# Patient Record
Sex: Male | Born: 1955
Health system: Southern US, Community
[De-identification: ages and names within clinical notes are randomized; demographics above are authoritative.]

## PROBLEM LIST (undated history)

## (undated) DIAGNOSIS — E119 Type 2 diabetes mellitus without complications: Secondary | ICD-10-CM

## (undated) DIAGNOSIS — M1990 Unspecified osteoarthritis, unspecified site: Secondary | ICD-10-CM

## (undated) DIAGNOSIS — R351 Nocturia: Secondary | ICD-10-CM

## (undated) DIAGNOSIS — K635 Polyp of colon: Secondary | ICD-10-CM

## (undated) DIAGNOSIS — N402 Nodular prostate without lower urinary tract symptoms: Secondary | ICD-10-CM

## (undated) DIAGNOSIS — I10 Essential (primary) hypertension: Secondary | ICD-10-CM

## (undated) DIAGNOSIS — T7840XA Allergy, unspecified, initial encounter: Secondary | ICD-10-CM

## (undated) DIAGNOSIS — C61 Malignant neoplasm of prostate: Secondary | ICD-10-CM

## (undated) DIAGNOSIS — R972 Elevated prostate specific antigen [PSA]: Secondary | ICD-10-CM

## (undated) HISTORY — DX: Polyp of colon: K63.5

## (undated) HISTORY — DX: Type 2 diabetes mellitus without complications: E11.9

## (undated) HISTORY — PX: COLONOSCOPY: SHX174

## (undated) HISTORY — DX: Malignant neoplasm of prostate: C61

## (undated) HISTORY — DX: Allergy, unspecified, initial encounter: T78.40XA

## (undated) HISTORY — PX: TONSILLECTOMY: SUR1361

---

## 1973-04-29 HISTORY — PX: HEMORRHOID SURGERY: SHX153

## 2001-01-05 ENCOUNTER — Encounter (INDEPENDENT_AMBULATORY_CARE_PROVIDER_SITE_OTHER): Payer: Self-pay | Admitting: Specialist

## 2001-01-05 ENCOUNTER — Other Ambulatory Visit: Admission: RE | Admit: 2001-01-05 | Discharge: 2001-01-05 | Payer: Self-pay | Admitting: Gastroenterology

## 2008-06-17 ENCOUNTER — Encounter (INDEPENDENT_AMBULATORY_CARE_PROVIDER_SITE_OTHER): Payer: Self-pay | Admitting: *Deleted

## 2009-05-10 ENCOUNTER — Telehealth: Payer: Self-pay | Admitting: Gastroenterology

## 2009-08-07 ENCOUNTER — Encounter (INDEPENDENT_AMBULATORY_CARE_PROVIDER_SITE_OTHER): Payer: Self-pay | Admitting: *Deleted

## 2009-08-23 ENCOUNTER — Encounter (INDEPENDENT_AMBULATORY_CARE_PROVIDER_SITE_OTHER): Payer: Self-pay | Admitting: *Deleted

## 2009-08-25 ENCOUNTER — Ambulatory Visit: Payer: Self-pay | Admitting: Gastroenterology

## 2010-05-29 NOTE — Progress Notes (Signed)
Summary: Schedule Colonoscopy  Phone Note Outgoing Call Call back at [facility] Phone [phone]   Call placed by: Harlow Mares CMA Duncan Dull),  May 10, 2009 4:02 PM Call placed to: Patient Summary of Call: Left message on patients machine to call back.  Initial call taken by: Harlow Mares CMA Duncan Dull),  May 10, 2009 4:02 PM  Follow-up for Phone Call        spoke to patient he will call us back to schedule, i gave him the number.  Follow-up by: Harlow Mares CMA Duncan Dull),  May 19, 2009 8:47 AM

## 2010-05-29 NOTE — Letter (Signed)
Summary: [facility] Instructions  [facility]  [address] [place], [place] [address]   Phone: [phone]  Fax: [phone]       Matthew Noble    19-Dec-1955    MRN: [id number]        Procedure Day Dorna Bloom:  Farrell Ours  09/08/09     Arrival Time:  12:30PM     Procedure Time:  1:30PM     Location of Procedure:                    _ X_  [facility] (4th Floor)                        PREPARATION FOR COLONOSCOPY WITH MOVIPREP   Starting 5 days prior to your procedure 09/03/09 do not eat nuts, seeds, popcorn, corn, beans, peas,  salads, or any raw vegetables.  Do not take any fiber supplements (e.g. Metamucil, Citrucel, and Benefiber).  THE DAY BEFORE YOUR PROCEDURE         DATE: 09/07/09  DAY: THURSDAY  1.  Drink clear liquids the entire day-NO SOLID FOOD  2.  Do not drink anything colored red or purple.  Avoid juices with pulp.  No orange juice.  3.  Drink at least 64 oz. (8 glasses) of fluid/clear liquids during the day to prevent dehydration and help the prep work efficiently.  CLEAR LIQUIDS INCLUDE: Water Jello Ice Popsicles Tea (sugar ok, no milk/cream) Powdered fruit flavored drinks Coffee (sugar ok, no milk/cream) Gatorade Juice: apple, white grape, white cranberry  Lemonade Clear bullion, consomm, broth Carbonated beverages (any kind) Strained chicken noodle soup Hard Candy                             4.  In the morning, mix first dose of MoviPrep solution:    Empty 1 Pouch A and 1 Pouch B into the disposable container    Add lukewarm drinking water to the top line of the container. Mix to dissolve    Refrigerate (mixed solution should be used within 24 hrs)  5.  Begin drinking the prep at 5:00 p.m. The MoviPrep container is divided by 4 marks.   Every 15 minutes drink the solution down to the next mark (approximately 8 oz) until the full liter is complete.   6.  Follow completed prep with 16 oz of clear liquid of your choice  (Nothing red or purple).  Continue to drink clear liquids until bedtime.  7.  Before going to bed, mix second dose of MoviPrep solution:    Empty 1 Pouch A and 1 Pouch B into the disposable container    Add lukewarm drinking water to the top line of the container. Mix to dissolve    Refrigerate  THE DAY OF YOUR PROCEDURE      DATE: 09/08/09  DAY: FRIDAY  Beginning at 8:30AM (5 hours before procedure):         1. Every 15 minutes, drink the solution down to the next mark (approx 8 oz) until the full liter is complete.  2. Follow completed prep with 16 oz. of clear liquid of your choice.    3. You may drink clear liquids until 11:30AM (2 HOURS BEFORE PROCEDURE).   MEDICATION INSTRUCTIONS  Unless otherwise instructed, you should take regular prescription medications with a small sip of water   as early as possible the morning  of your procedure.   Additional medication instructions: Take your Atenolol the morning of procedure.         OTHER INSTRUCTIONS  You will need a responsible adult at least 57 years of age to accompany you and drive you home.   This person must remain in the waiting room during your procedure.  Wear loose fitting clothing that is easily removed.  Leave jewelry and other valuables at home.  However, you may wish to bring a book to read or  an iPod/MP3 player to listen to music as you wait for your procedure to start.  Remove all body piercing jewelry and leave at home.  Total time from sign-in until discharge is approximately 2-3 hours.  You should go home directly after your procedure and rest.  You can resume normal activities the  day after your procedure.  The day of your procedure you should not:   Drive   Make legal decisions   Operate machinery   Drink alcohol   Return to work  You will receive specific instructions about eating, activities and medications before you leave.    The above instructions have been reviewed and  explained to me by  Matthew Almas RN  August 25, 2009 2:20 PM     I fully understand and can verbalize these instructions _____________________________ Date _________

## 2010-05-29 NOTE — Letter (Signed)
Summary: Previsit letter  [facility]  [address] [place], [place] [address]   Phone: [phone]  Fax: [phone]       08/07/2009 MRN: [id number]  Matthew Noble [address] [place], [place]  [address]  [place]  Dear Mr. [name],  Welcome to the Gastroenterology Division at [facility].    You are scheduled to see a nurse for your pre-procedure visit on 08/24/2009 at 8:00AM on the 3rd floor at [facility], [address]  We ask that you try to arrive at our office 15 minutes prior to your appointment time to allow for check-in.  Your nurse visit will consist of discussing your medical and surgical history, your immediate family medical history, and your medications.    Please bring a complete list of all your medications or, if you prefer, bring the medication bottles and we will list them.  We will need to be aware of both prescribed and over the counter drugs.  We will need to know exact dosage information as well.  If you are on blood thinners (Coumadin, Plavix, Aggrenox, Ticlid, etc.) please call our office today/prior to your appointment, as we need to consult with your physician about holding your medication.   Please be prepared to read and sign documents such as consent forms, a financial agreement, and acknowledgement forms.  If necessary, and with your consent, a friend or relative is welcome to sit-in on the nurse visit with you.  Please bring your insurance card so that we may make a copy of it.  If your insurance requires a referral to see a specialist, please bring your referral form from your primary care physician.  No co-pay is required for this nurse visit.     If you cannot keep your appointment, please call [phone] to cancel or reschedule prior to your appointment date.  This allows [place] the opportunity to schedule an appointment for another patient in need of care.    Thank you for choosing [facility] for your  medical needs.  We appreciate the opportunity to care for you.  Please visit [place] at our website  to learn more about our practice.                     Sincerely.                                                                                                                   The Gastroenterology Division

## 2010-05-29 NOTE — Miscellaneous (Signed)
Summary: lec pREVISIT/PREP  Clinical Lists Changes  Medications: Added new medication of MOVIPREP 100 GM  SOLR (PEG-KCL-NACL-NASULF-NA ASC-C) As per prep instructions. - Signed Rx of MOVIPREP 100 GM  SOLR (PEG-KCL-NACL-NASULF-NA ASC-C) As per prep instructions.;  #1 x 0;  Signed;  Entered by: Wyona Almas RN;  Authorized by: Meryl Dare MD Clementeen Graham;  Method used: Electronically to [facility] [phone]*, [address] [place], [place]  [address], Ph: [phone], Fax: [phone] Observations: Added new observation of NKA: T (08/25/2009 13:52)    Prescriptions: MOVIPREP 100 GM  SOLR (PEG-KCL-NACL-NASULF-NA ASC-C) As per prep instructions.  #1 x 0   Entered by:   Wyona Almas RN   Authorized by:   Meryl Dare MD [facility]   Signed by:   Wyona Almas RN on 08/25/2009   Method used:   Electronically to        [facility] [phone]* (retail)       [address]       [place], [place]  [address]       Ph: [phone]       Fax: [phone]   RxID:   [id number]

## 2013-03-17 ENCOUNTER — Other Ambulatory Visit: Payer: Self-pay | Admitting: Urology

## 2013-03-30 ENCOUNTER — Encounter (HOSPITAL_BASED_OUTPATIENT_CLINIC_OR_DEPARTMENT_OTHER): Payer: Self-pay | Admitting: *Deleted

## 2013-03-30 NOTE — Progress Notes (Signed)
NPO AFTER MN WITH EXCEPTION WATER/ GATORADE/ BLACK COFFEE (NO CREAM/ MILK PRODUCTS) UNTIL 0700. ARRIVE AT 1145. NEEDS ISTAT AND EKG. WILL DO FLEET ENEMA AM DOS.

## 2013-03-30 NOTE — Progress Notes (Addendum)
03/30/13 1248  OBSTRUCTIVE SLEEP APNEA  Have you ever been diagnosed with sleep apnea through a sleep study? No  Do you snore loudly (loud enough to be heard through closed doors)?  1  Do you often feel tired, fatigued, or sleepy during the daytime? 0  Has anyone observed you stop breathing during your sleep? 1  Do you have, or are you being treated for high blood pressure? 1  BMI more than 35 kg/m2? 0  Age over 59 years old? 1  Neck circumference greater than 40 cm/18 inches? 0  Gender: 1  Obstructive Sleep Apnea Score 5  Score 4 or greater  Results sent to PCP    FAXED RESULTS TO PT PCP ,  DR DAVID Grayce Sessions  W/ [facility]  IN [place]  OFFICE [phone]

## 2013-03-31 NOTE — H&P (Signed)
ctive Problems Problems   1. Elevated prostate specific antigen (PSA) (790.93)  2. Nodular prostate without lower urinary tract symptoms (600.10)  History of Present Illness     Matthew Noble is a former patient who I last saw in 2007 for an elevated PSA.  He had a negative biopsy done for prostate induration and a PSA of 2.8.  There was chronic inflammation on the biopsy.  He is sent back in consultation by Dr. Tracey Harries for a further PSA elevation of 6.97 on 01/25/13.   He had a PSA of 5.48 which apparently went back down after antibiotics.   He has some frequency but no other voiding complaints.   He has had no hematuria.   Past Medical History Problems   1. History of arthritis (V13.4)  2. History of esophageal reflux (V12.79)  3. History of hypertension (V12.59)  Surgical History Problems   1. History of Biopsy Of The Prostate Needle  2. History of Hemorrhoidectomy  3. History of Tonsillectomy  Current Meds  1. Aspirin 81 MG Oral Tablet;  Therapy: (Recorded:17Nov2014) to Recorded  2. Atenolol-Chlorthalidone 50-25 MG Oral Tablet;  Therapy: (Recorded:17Nov2014) to Recorded  3. Vitamin B-12 TABS;  Therapy: (Recorded:17Nov2014) to Recorded  4. Vitamin D TABS;  Therapy: (Recorded:17Nov2014) to Recorded  Allergies Medication   1. No Known Drug Allergies  Family History Problems   1. Family history of cardiac disorder (V17.49) : Mother, Father  2. Family history of hypertension (V17.49)  Social History Problems    Alcohol use   Daily caffeine consumption, 2-3 servings a day   Death in the family, father   Death in the family, mother   Divorced   Former smoker (V15.82)   Occupation   Two children  Review of Systems Genitourinary, constitutional, skin, eye, otolaryngeal, hematologic/lymphatic, cardiovascular, pulmonary, endocrine, musculoskeletal, gastrointestinal, neurological and psychiatric system(s) were reviewed and pertinent findings if present are  noted.  Genitourinary: urinary frequency.  Psychiatric: anxiety.    Vitals Vital Signs [Data Includes: Last 1 Day]  Recorded: 17Nov2014 03:24PM  Height: 6 ft  Weight: 175 lb  BMI Calculated: 23.73 BSA Calculated: 2.01 Blood Pressure: 146 / 88 Temperature: 97.1 F Heart Rate: 68  Physical Exam Constitutional: Well nourished and well developed . No acute distress.  ENT:. The ears and nose are normal in appearance.  Neck: The appearance of the neck is normal and no neck mass is present.  Pulmonary: No respiratory distress and normal respiratory rhythm and effort.  Cardiovascular: Heart rate and rhythm are normal . No peripheral edema.  Abdomen: The abdomen is soft and nontender. No masses are palpated. No CVA tenderness. No hernias are palpable. No hepatosplenomegaly noted.  Rectal: Rectal exam demonstrates normal sphincter tone, no tenderness and no masses. Estimated prostate size is 2+. The prostate has a palpable nodule (About 2cm more medial that lateral and firm. ) involving the right, mid aspect of the prostate which appears to be confined within the prostate capsule and is not tender. The left seminal vesicle is nonpalpable. The right seminal vesicle is nonpalpable. The perineum is normal on inspection.  Genitourinary: Examination of the penis demonstrates no discharge, no masses, no lesions and a normal meatus. The scrotum is without lesions. The right epididymis is palpably normal and non-tender. The left epididymis is palpably normal and non-tender. The right testis is atrophic, but non-tender and without masses. The left testis is atrophic, but non-tender and without masses.  Lymphatics: The supraclavicular, axillary, femoral and inguinal nodes are  not enlarged or tender.  Skin: Normal skin turgor, no visible rash and no visible skin lesions.  Neuro/Psych:. Mood and affect are appropriate. Normal sensation of the perineum/perianal region (S3,4,5).    Results/Data Urine [Data  Includes: Last 1 Day]   17Nov2014  COLOR STRAW   APPEARANCE CLEAR   SPECIFIC GRAVITY 1.015   pH 6.0   GLUCOSE NEG mg/dL  BILIRUBIN NEG   KETONE NEG mg/dL  BLOOD NEG   PROTEIN NEG mg/dL  UROBILINOGEN 0.2 mg/dL  NITRITE NEG   LEUKOCYTE ESTERASE TRACE   SQUAMOUS EPITHELIAL/HPF NONE SEEN   WBC NONE SEEN WBC/hpf  RBC NONE SEEN RBC/hpf  BACTERIA NONE SEEN   CRYSTALS NONE SEEN   CASTS NONE SEEN    Old records or history reviewed: I have reviewed records and labs from Dr. Everlene Other and I have reviewed our prior notes.  The following clinical lab reports were reviewed:  UA reviewed. PSA reviewed.    Assessment Assessed   1. Elevated prostate specific antigen (PSA) (790.93)  2. Nodular prostate without lower urinary tract symptoms (600.10)   He has a further rise in his PSA to almost 7 and a right prostate nodule.   Plan Elevated prostate specific antigen (PSA)   1. Follow-up Schedule Surgery Office  Follow-up  Status: Hold For - Appointment   Requested for: 17Nov2014 Health Maintenance   2. UA With REFLEX; Status:Resulted - Requires Verification;   Done: 17Nov2014 03:15PM   I am going to set him up for a prostate biopsy and [place] as and outpatient under anesthesia since his prior experience was painful. I reviewed the risks of bleeding, infection and voiding difficulty and will get rocephin and gentamycin at the time of the procedure.   Discussion/Summary  CC: Dr. Tracey Harries.

## 2013-04-01 ENCOUNTER — Ambulatory Visit (HOSPITAL_BASED_OUTPATIENT_CLINIC_OR_DEPARTMENT_OTHER): Payer: BC Managed Care – PPO | Admitting: Certified Registered"

## 2013-04-01 ENCOUNTER — Encounter (HOSPITAL_BASED_OUTPATIENT_CLINIC_OR_DEPARTMENT_OTHER): Payer: Self-pay | Admitting: Certified Registered"

## 2013-04-01 ENCOUNTER — Encounter (HOSPITAL_BASED_OUTPATIENT_CLINIC_OR_DEPARTMENT_OTHER)
Admission: RE | Disposition: A | Discharge status: Home / Self Care | Payer: Self-pay | Source: Ambulatory Visit | Attending: Urology

## 2013-04-01 ENCOUNTER — Encounter (HOSPITAL_BASED_OUTPATIENT_CLINIC_OR_DEPARTMENT_OTHER): Payer: BC Managed Care – PPO | Admitting: Certified Registered"

## 2013-04-01 ENCOUNTER — Other Ambulatory Visit: Payer: Self-pay

## 2013-04-01 ENCOUNTER — Ambulatory Visit (HOSPITAL_BASED_OUTPATIENT_CLINIC_OR_DEPARTMENT_OTHER)
Admission: RE | Admit: 2013-04-01 | Discharge: 2013-04-01 | Disposition: A | Discharge status: Home / Self Care | Payer: BC Managed Care – PPO | Source: Ambulatory Visit | Attending: Urology | Admitting: Urology

## 2013-04-01 DIAGNOSIS — Z79899 Other long term (current) drug therapy: Secondary | ICD-10-CM | POA: Insufficient documentation

## 2013-04-01 DIAGNOSIS — I10 Essential (primary) hypertension: Secondary | ICD-10-CM | POA: Insufficient documentation

## 2013-04-01 DIAGNOSIS — K219 Gastro-esophageal reflux disease without esophagitis: Secondary | ICD-10-CM | POA: Insufficient documentation

## 2013-04-01 DIAGNOSIS — Z7982 Long term (current) use of aspirin: Secondary | ICD-10-CM | POA: Insufficient documentation

## 2013-04-01 DIAGNOSIS — Z87891 Personal history of nicotine dependence: Secondary | ICD-10-CM | POA: Insufficient documentation

## 2013-04-01 DIAGNOSIS — C61 Malignant neoplasm of prostate: Secondary | ICD-10-CM | POA: Insufficient documentation

## 2013-04-01 HISTORY — DX: Nodular prostate without lower urinary tract symptoms: N40.2

## 2013-04-01 HISTORY — DX: Elevated prostate specific antigen (PSA): R97.20

## 2013-04-01 HISTORY — PX: PROSTATE BIOPSY: SHX241

## 2013-04-01 HISTORY — DX: Nocturia: R35.1

## 2013-04-01 HISTORY — DX: Unspecified osteoarthritis, unspecified site: M19.90

## 2013-04-01 HISTORY — DX: Essential (primary) hypertension: I10

## 2013-04-01 LAB — POCT I-STAT 4, (NA,K, GLUC, HGB,HCT)
Glucose, Bld: 94 mg/dL (ref 70–99)
HCT: 47 % (ref 39.0–52.0)
Hemoglobin: 16 g/dL (ref 13.0–17.0)
Potassium: 3.6 mEq/L (ref 3.5–5.1)

## 2013-04-01 LAB — POCT I-STAT, CHEM 8
Glucose, Bld: 94 mg/dL (ref 70–99)
HCT: 44 % (ref 39.0–52.0)
Hemoglobin: 15 g/dL (ref 13.0–17.0)
Potassium: 3.6 mEq/L (ref 3.5–5.1)
Sodium: 141 mEq/L (ref 135–145)
TCO2: 24 mmol/L (ref 0–100)

## 2013-04-01 SURGERY — BIOPSY, PROSTATE
Anesthesia: Monitor Anesthesia Care | Site: Prostate

## 2013-04-01 MED ORDER — MIDAZOLAM HCL 2 MG/2ML IJ SOLN
INTRAMUSCULAR | Status: AC
  Filled 2013-04-01: qty 2

## 2013-04-01 MED ORDER — FENTANYL CITRATE 0.05 MG/ML IJ SOLN
INTRAMUSCULAR | Status: AC
  Filled 2013-04-01: qty 2

## 2013-04-01 MED ORDER — LIDOCAINE HCL (CARDIAC) 20 MG/ML IV SOLN
INTRAVENOUS | Status: DC | PRN
  Administered 2013-04-01: 60 mg via INTRAVENOUS

## 2013-04-01 MED ORDER — LACTATED RINGERS IV SOLN
INTRAVENOUS | Status: DC
  Filled 2013-04-01: qty 1000

## 2013-04-01 MED ORDER — ONDANSETRON HCL 4 MG/2ML IJ SOLN
4.0000 mg | Freq: Four times a day (QID) | INTRAMUSCULAR | Status: DC | PRN
  Filled 2013-04-01: qty 2

## 2013-04-01 MED ORDER — ACETAMINOPHEN 325 MG PO TABS
650.0000 mg | ORAL_TABLET | ORAL | Status: DC | PRN
  Filled 2013-04-01: qty 2

## 2013-04-01 MED ORDER — OXYCODONE HCL 5 MG PO TABS
5.0000 mg | ORAL_TABLET | ORAL | Status: DC | PRN
  Filled 2013-04-01: qty 2

## 2013-04-01 MED ORDER — ACETAMINOPHEN 650 MG RE SUPP
650.0000 mg | RECTAL | Status: DC | PRN
  Filled 2013-04-01: qty 1

## 2013-04-01 MED ORDER — FENTANYL CITRATE 0.05 MG/ML IJ SOLN
25.0000 ug | INTRAMUSCULAR | Status: DC | PRN
  Filled 2013-04-01: qty 1

## 2013-04-01 MED ORDER — SODIUM CHLORIDE 0.9 % IJ SOLN
3.0000 mL | Freq: Two times a day (BID) | INTRAMUSCULAR | Status: DC
  Filled 2013-04-01: qty 3

## 2013-04-01 MED ORDER — SODIUM CHLORIDE 0.9 % IV SOLN
250.0000 mL | INTRAVENOUS | Status: DC | PRN
  Filled 2013-04-01: qty 250

## 2013-04-01 MED ORDER — MIDAZOLAM HCL 5 MG/5ML IJ SOLN
INTRAMUSCULAR | Status: DC | PRN
  Administered 2013-04-01: 2 mg via INTRAVENOUS

## 2013-04-01 MED ORDER — LACTATED RINGERS IV SOLN
INTRAVENOUS | Status: DC
  Administered 2013-04-01: 13:00:00 via INTRAVENOUS
  Filled 2013-04-01: qty 1000

## 2013-04-01 MED ORDER — FENTANYL CITRATE 0.05 MG/ML IJ SOLN
INTRAMUSCULAR | Status: DC | PRN
  Administered 2013-04-01: 50 ug via INTRAVENOUS

## 2013-04-01 MED ORDER — CIPROFLOXACIN HCL 500 MG PO TABS: 500.0000 mg | ORAL_TABLET | Freq: Two times a day (BID) | ORAL | Status: DC

## 2013-04-01 MED ORDER — PROPOFOL 10 MG/ML IV EMUL
INTRAVENOUS | Status: DC | PRN
  Administered 2013-04-01: 180 ug/kg/min via INTRAVENOUS

## 2013-04-01 MED ORDER — CEFTRIAXONE SODIUM 2 G IJ SOLR
INTRAMUSCULAR | Status: AC
  Filled 2013-04-01: qty 2

## 2013-04-01 MED ORDER — SODIUM CHLORIDE 0.9 % IJ SOLN
3.0000 mL | INTRAMUSCULAR | Status: DC | PRN
  Filled 2013-04-01: qty 3

## 2013-04-01 MED ORDER — DEXTROSE 5 % IV SOLN
2.0000 g | Freq: Once | INTRAVENOUS | Status: AC
  Administered 2013-04-01: 2 g via INTRAVENOUS
  Filled 2013-04-01: qty 2

## 2013-04-01 MED ORDER — GENTAMICIN SULFATE 40 MG/ML IJ SOLN
5.0000 mg/kg | INTRAVENOUS | Status: AC
  Administered 2013-04-01: 300 mg via INTRAVENOUS

## 2013-04-01 MED ORDER — HYDROCODONE-ACETAMINOPHEN 5-325 MG PO TABS: 1.0000 | ORAL_TABLET | Freq: Four times a day (QID) | ORAL | Status: DC | PRN

## 2013-04-01 SURGICAL SUPPLY — 5 items
DRESSING TELFA 8X3 (GAUZE/BANDAGES/DRESSINGS) ×1 IMPLANT
GLOVE SURG SS PI 8.0 STRL IVOR (GLOVE) IMPLANT
SURGILUBE 2OZ TUBE FLIPTOP (MISCELLANEOUS) ×2 IMPLANT
TOWEL OR 17X24 6PK STRL BLUE (TOWEL DISPOSABLE) ×1 IMPLANT
UNDERPAD 30X30 INCONTINENT (UNDERPADS AND DIAPERS) ×2 IMPLANT

## 2013-04-01 NOTE — Brief Op Note (Signed)
04/01/2013  1:54 PM  PATIENT:  Matthew Noble  57 y.o. male  PRE-OPERATIVE DIAGNOSIS:  nodular prostate with elevated PSA  POST-OPERATIVE DIAGNOSIS:  nodular prostate with elevated PSA  PROCEDURE:  Procedure(s): PROSTATE BIOPSY AND ULTRASOUND (N/A)  SURGEON:  Surgeon(s) and Role:    * Bjorn Pippin, MD - Primary  PHYSICIAN ASSISTANT:   ASSISTANTS: none   ANESTHESIA:   MAC  EBL:     BLOOD ADMINISTERED:none  DRAINS: none   LOCAL MEDICATIONS USED:  NONE  SPECIMEN:  Source of Specimen:  13 core prostate biopsy  DISPOSITION OF SPECIMEN:  PATHOLOGY  COUNTS:  YES  TOURNIQUET:  * No tourniquets in log *  DICTATION: .Other Dictation: Dictation Number [id number]  PLAN OF CARE: Discharge to home after PACU  PATIENT DISPOSITION:  PACU - hemodynamically stable.   Delay start of Pharmacological VTE agent (>24hrs) due to surgical blood loss or risk of bleeding: not applicable

## 2013-04-01 NOTE — Interval H&P Note (Signed)
History and Physical Interval Note:  04/01/2013 1:17 PM  Matthew Noble  has presented today for surgery, with the diagnosis of nodular prostate with elevated PSA  The various methods of treatment have been discussed with the patient and family. After consideration of risks, benefits and other options for treatment, the patient has consented to  Procedure(s): PROSTATE BIOPSY AND ULTRASOUND (N/A) as a surgical intervention .  The patient's history has been reviewed, patient examined, no change in status, stable for surgery.  I have reviewed the patient's chart and labs.  Questions were answered to the patient's satisfaction.     Teruo Stilley J

## 2013-04-01 NOTE — Anesthesia Preprocedure Evaluation (Addendum)
Anesthesia Evaluation  Patient identified by MRN, date of birth, ID band Patient awake    Reviewed: Allergy & Precautions, H&P , NPO status , Patient's Chart, lab work & pertinent test results, reviewed documented beta blocker date and time   Airway Mallampati: II TM Distance: >3 FB Neck ROM: full    Dental  (+) Teeth Intact and Dental Advisory Given   Pulmonary neg pulmonary ROS, Current Smoker,  breath sounds clear to auscultation  Pulmonary exam normal       Cardiovascular Exercise Tolerance: Good hypertension, Pt. on home beta blockers Rhythm:regular Rate:Normal     Neuro/Psych negative neurological ROS  negative psych ROS   GI/Hepatic negative GI ROS, Neg liver ROS,   Endo/Other  negative endocrine ROS  Renal/GU negative Renal ROS  negative genitourinary   Musculoskeletal   Abdominal   Peds  Hematology negative hematology ROS (+)   Anesthesia Other Findings   Reproductive/Obstetrics negative OB ROS                          Anesthesia Physical Anesthesia Plan  ASA: II  Anesthesia Plan: MAC   Post-op Pain Management:    Induction:   Airway Management Planned: Simple Face Mask  Additional Equipment:   Intra-op Plan:   Post-operative Plan:   Informed Consent: I have reviewed the patients History and Physical, chart, labs and discussed the procedure including the risks, benefits and alternatives for the proposed anesthesia with the patient or authorized representative who has indicated his/her understanding and acceptance.   Dental Advisory Given  Plan Discussed with: CRNA and Surgeon  Anesthesia Plan Comments:         Anesthesia Quick Evaluation

## 2013-04-01 NOTE — Transfer of Care (Signed)
Immediate Anesthesia Transfer of Care Note  Patient: Matthew Noble  Procedure(s) Performed: Procedure(s) (LRB): PROSTATE BIOPSY AND ULTRASOUND (N/A)  Patient Location: PACU  Anesthesia Type: MAC  Level of Consciousness: awake, alert , oriented and patient cooperative  Airway & Oxygen Therapy: Patient Spontanous Breathing and Patient connected to face mask oxygen  Post-op Assessment: Report given to PACU RN and Post -op Vital signs reviewed and stable  Post vital signs: Reviewed and stable  Complications: No apparent anesthesia complications

## 2013-04-02 ENCOUNTER — Encounter (HOSPITAL_BASED_OUTPATIENT_CLINIC_OR_DEPARTMENT_OTHER): Payer: Self-pay | Admitting: Urology

## 2013-04-02 NOTE — Anesthesia Postprocedure Evaluation (Signed)
  Anesthesia Post-op Note  Patient: Matthew Noble  Procedure(s) Performed: Procedure(s) (LRB): PROSTATE BIOPSY AND ULTRASOUND (N/A)  Patient Location: PACU  Anesthesia Type: MAC  Level of Consciousness: awake and alert   Airway and Oxygen Therapy: Patient Spontanous Breathing  Post-op Pain: mild  Post-op Assessment: Post-op Vital signs reviewed, Patient's Cardiovascular Status Stable, Respiratory Function Stable, Patent Airway and No signs of Nausea or vomiting  Last Vitals:  Filed Vitals:   04/01/13 1545  BP: 122/78  Pulse: 69  Temp: 36.3 C  Resp: 18    Post-op Vital Signs: stable   Complications: No apparent anesthesia complications

## 2013-04-05 NOTE — Op Note (Signed)
NAMEMIKEY, MAFFETT            ACCOUNT NO.:  [id number]  MEDICAL RECORD NO.:  [id number]  LOCATION:                                 FACILITY:  PHYSICIAN:  Excell Seltzer. Annabell Howells, M.D.    DATE OF BIRTH:  24-Sep-1955  DATE OF PROCEDURE:  04/01/2013 DATE OF DISCHARGE:  04/01/2013                              OPERATIVE REPORT   PROCEDURE:  Prostate ultrasound and ultrasound-guided prostate biopsy.  PREOPERATIVE DIAGNOSIS:  Right prostate nodule with elevated PSA.  POSTOPERATIVE DIAGNOSIS:  Right prostate nodule with elevated PSA.  SURGEON:  Excell Seltzer. Annabell Howells, M.D.  ANESTHESIA:  General.  SPECIMEN:  13 prostate biopsy cores, 12 from the standard configuration with a 13th between the base and midplanes medially in the area of his prostate lesion.  BLOOD LOSS:  Minimal.  COMPLICATIONS:  None.  INDICATIONS:  Mr. Ericsson is a 57 year old African American male, who had biopsied in 2007, for PSA of 2.8 and prostate induration.  There was inflammation on the biopsy.  He was sent back to Dr. Earnest Bailey for further PSA elevation to 6.97 on January 25, 2013, and repeat of 5.48 after antibiotics.  He had a persistent right nodule and it was felt that the biopsy was indicated.  He had pain with the prior biopsy, so he elected to have it done under MAC.  FINDINGS OF PROCEDURE:  He was taken to the operating room after using the enema, for the prep as routine, he was given IV Cipro and gentamicin.  He was placed in the left lateral knee chest position, an IV sedation was given as needed.  The 10-megahertz ultrasound probe was assembled and inserted, scanning was performed.  Examination revealed normal-appearing seminal vesicles.  The prostate had a volume of 36.24 mL, with a width of 4.77 cm, height of 3.19 cm and a length of 4.55 cm. There was a hypoechoic lesion in the right lobe of the prostate that extended from just above the base to the apex, but was most prominent in the midprostate  involving the peripheral zone.  The remainder of the exam was unremarkable and he had no significant middle lobe.  Once the diagnostic scan had been performed, the spring-loaded biopsy needle was then used and guided by ultrasound.  Cores were obtained from the left lateral base, left medial base, and extra cores taken between the base and midplanes in the right medial prostate.  Biopsies were obtained from the right midprostate in medial and lateral planes and the right apex in the medial and lateral planes.  The left prostate was then biopsied in an identical fashion without the extra core as noted on the right side.  Once the biopsies had been completed, the ultrasound probe was removed, minimal hemorrhage was noted.  The patient's perineum was cleansed.  He was placed supine on the stretcher and sedation stopped.  He was then taken to the recovery room in stable condition.  There were no complications.     Excell Seltzer. Annabell Howells, M.D.     JJW/MEDQ  D:  04/01/2013  T:  04/02/2013  Job:  [id number]

## 2014-02-11 ENCOUNTER — Other Ambulatory Visit: Payer: Self-pay

## 2014-09-21 ENCOUNTER — Encounter: Payer: Self-pay | Admitting: Gastroenterology

## 2014-11-28 ENCOUNTER — Encounter: Payer: Self-pay | Admitting: Gastroenterology

## 2014-11-28 ENCOUNTER — Ambulatory Visit (INDEPENDENT_AMBULATORY_CARE_PROVIDER_SITE_OTHER): Payer: BC Managed Care – PPO | Admitting: Gastroenterology

## 2014-11-28 ENCOUNTER — Telehealth: Payer: Self-pay | Admitting: Gastroenterology

## 2014-11-28 VITALS — BP 126/74 | HR 68 | Ht 71.0 in | Wt 163.0 lb

## 2014-11-28 DIAGNOSIS — K921 Melena: Secondary | ICD-10-CM | POA: Diagnosis not present

## 2014-11-28 DIAGNOSIS — R195 Other fecal abnormalities: Secondary | ICD-10-CM | POA: Diagnosis not present

## 2014-11-28 DIAGNOSIS — Z86010 Personal history of colonic polyps: Secondary | ICD-10-CM | POA: Diagnosis not present

## 2014-11-28 MED ORDER — NA SULFATE-K SULFATE-MG SULF 17.5-3.13-1.6 GM/177ML PO SOLN
1.0000 | Freq: Once | ORAL | Status: DC
Start: 2014-11-28 — End: 2015-02-06

## 2014-11-28 NOTE — Progress Notes (Signed)
    History of Present Illness: This is a 59 year old male referred by Bernerd Limbo, MD for the evaluation of occult blood in stool. Patient underwent colonoscopy in 2002 for hematochezia and Hemoccult-positive stool showing one adenomatous colon polyp and large internal hemorrhoids that were treated with injection. He also had external hemorrhoids. He underwent follow-up colonoscopy in 2005 with no polyps noted and repeated injection of his internal hemorrhoids. Patient notes intermittent small volumes of bright red blood per rectum with bowel movements every several months and the symptoms have not changed. Recent stool testing for occult blood was positive. He has no other gastrointestinal complaints. Denies weight loss, abdominal pain, constipation, diarrhea, change in stool caliber, melena, nausea, vomiting, dysphagia, reflux symptoms, chest pain.  Review of Systems: Pertinent positive and negative review of systems were noted in the above HPI section. All other review of systems were otherwise negative.  Current Medications, Allergies, Past Medical History, Past Surgical History, Family History and Social History were reviewed in [employer] record.  Physical Exam: General: Well developed, well nourished, no acute distress Head: Normocephalic and atraumatic Eyes:  sclerae anicteric, EOMI Ears: Normal auditory acuity Mouth: No deformity or lesions Neck: Supple, no masses or thyromegaly Lungs: Clear throughout to auscultation Heart: Regular rate and rhythm; no murmurs, rubs or bruits Abdomen: Soft, non tender and non distended. No masses, hepatosplenomegaly or hernias noted. Normal Bowel sounds Rectal: Deferred to colonoscopy Musculoskeletal: Symmetrical with no gross deformities  Skin: No lesions on visible extremities Pulses:  Normal pulses noted Extremities: No clubbing, cyanosis, edema or deformities noted Neurological: Alert oriented x 4, grossly  nonfocal Cervical Nodes:  No significant cervical adenopathy Inguinal Nodes: No significant inguinal adenopathy Psychological:  Alert and cooperative. Normal mood and affect  Assessment and Recommendations:  1. Occult blood in stool. Intermittent small-volume hematochezia. Personal history of adenomatous colon polyps. Schedule colonoscopy. The risks (including bleeding, perforation, infection, missed lesions, medication reactions and possible hospitalization or surgery if complications occur), benefits, and alternatives to colonoscopy with possible biopsy and possible polypectomy were discussed with the patient and they consent to proceed.    cc: Bernerd Limbo, MD [address] [place], [place] [address]

## 2014-11-28 NOTE — Patient Instructions (Signed)
You have been scheduled for a colonoscopy. Please follow written instructions given to you at your visit today.  Please pick up your prep supplies at the pharmacy within the next 1-3 days. If you use inhalers (even only as needed), please bring them with you on the day of your procedure. Your physician has requested that you go to www.startemmi.com and enter the access code given to you at your visit today. This web site gives a general overview about your procedure. However, you should still follow specific instructions given to you by our office regarding your preparation for the procedure.  Thank you for choosing me and [facility].  Pricilla Riffle. Dagoberto Ligas., MD., Marval Regal  cc: Bernerd Limbo, MD

## 2014-11-28 NOTE — Telephone Encounter (Signed)
A user error has taken place.

## 2014-11-30 ENCOUNTER — Encounter: Payer: Self-pay | Admitting: Gastroenterology

## 2015-02-06 ENCOUNTER — Telehealth: Payer: Self-pay | Admitting: Gastroenterology

## 2015-02-06 DIAGNOSIS — R195 Other fecal abnormalities: Secondary | ICD-10-CM

## 2015-02-06 DIAGNOSIS — Z86010 Personal history of colonic polyps: Secondary | ICD-10-CM

## 2015-02-06 MED ORDER — NA SULFATE-K SULFATE-MG SULF 17.5-3.13-1.6 GM/177ML PO SOLN: 1.0000 | Freq: Once | ORAL | Status: DC

## 2015-02-06 NOTE — Telephone Encounter (Signed)
Prescription resent to patient's pharmacy. 

## 2015-02-07 ENCOUNTER — Telehealth: Payer: Self-pay | Admitting: Gastroenterology

## 2015-02-07 DIAGNOSIS — Z86010 Personal history of colonic polyps: Secondary | ICD-10-CM

## 2015-02-07 DIAGNOSIS — R195 Other fecal abnormalities: Secondary | ICD-10-CM

## 2015-02-07 MED ORDER — NA SULFATE-K SULFATE-MG SULF 17.5-3.13-1.6 GM/177ML PO SOLN: 1.0000 | Freq: Once | ORAL | Status: DC

## 2015-02-07 NOTE — Telephone Encounter (Signed)
Prescription resent to patient's pharmacy. Pt notified and pt verbalized understanding.

## 2015-02-09 ENCOUNTER — Encounter: Payer: Self-pay | Admitting: Gastroenterology

## 2015-02-09 ENCOUNTER — Ambulatory Visit (AMBULATORY_SURGERY_CENTER): Payer: BC Managed Care – PPO | Admitting: Gastroenterology

## 2015-02-09 VITALS — BP 134/70 | HR 57 | Temp 96.6°F | Resp 18 | Ht 71.0 in | Wt 163.0 lb

## 2015-02-09 DIAGNOSIS — D124 Benign neoplasm of descending colon: Secondary | ICD-10-CM

## 2015-02-09 DIAGNOSIS — K921 Melena: Secondary | ICD-10-CM | POA: Diagnosis not present

## 2015-02-09 DIAGNOSIS — K621 Rectal polyp: Secondary | ICD-10-CM

## 2015-02-09 DIAGNOSIS — Z86010 Personal history of colonic polyps: Secondary | ICD-10-CM

## 2015-02-09 DIAGNOSIS — K635 Polyp of colon: Secondary | ICD-10-CM

## 2015-02-09 DIAGNOSIS — D125 Benign neoplasm of sigmoid colon: Secondary | ICD-10-CM | POA: Diagnosis not present

## 2015-02-09 DIAGNOSIS — R195 Other fecal abnormalities: Secondary | ICD-10-CM

## 2015-02-09 DIAGNOSIS — D214 Benign neoplasm of connective and other soft tissue of abdomen: Secondary | ICD-10-CM | POA: Diagnosis not present

## 2015-02-09 DIAGNOSIS — D123 Benign neoplasm of transverse colon: Secondary | ICD-10-CM

## 2015-02-09 DIAGNOSIS — Z860101 Personal history of adenomatous and serrated colon polyps: Secondary | ICD-10-CM

## 2015-02-09 MED ORDER — DEXTROSE 5 % IV SOLN: INTRAVENOUS | Status: DC

## 2015-02-09 MED ORDER — SODIUM CHLORIDE 0.9 % IV SOLN: 500.0000 mL | INTRAVENOUS | Status: DC

## 2015-02-09 NOTE — Patient Instructions (Signed)
YOU HAD AN ENDOSCOPIC PROCEDURE TODAY AT [facility]:   Refer to the procedure report that was given to you for any specific questions about what was found during the examination.  If the procedure report does not answer your questions, please call your gastroenterologist to clarify.  If you requested that your care partner not be given the details of your procedure findings, then the procedure report has been included in a sealed envelope for you to review at your convenience later.  YOU SHOULD EXPECT: Some feelings of bloating in the abdomen. Passage of more gas than usual.  Walking can help get rid of the air that was put into your GI tract during the procedure and reduce the bloating. If you had a lower endoscopy (such as a colonoscopy or flexible sigmoidoscopy) you may notice spotting of blood in your stool or on the toilet paper. If you underwent a bowel prep for your procedure, you may not have a normal bowel movement for a few days.  Please Note:  You might notice some irritation and congestion in your nose or some drainage.  This is from the oxygen used during your procedure.  There is no need for concern and it should clear up in a day or so.  SYMPTOMS TO REPORT IMMEDIATELY:   Following lower endoscopy (colonoscopy or flexible sigmoidoscopy):  Excessive amounts of blood in the stool  Significant tenderness or worsening of abdominal pains  Swelling of the abdomen that is new, acute  Fever of 100F or higher    For urgent or emergent issues, a gastroenterologist can be reached at any hour by calling [phone].   DIET: Your first meal following the procedure should be a small meal and then it is ok to progress to your normal diet. Heavy or fried foods are harder to digest and may make you feel nauseous or bloated.  Likewise, meals heavy in dairy and vegetables can increase bloating.  Drink plenty of fluids but you should avoid alcoholic beverages for 24  hours.  ACTIVITY:  You should plan to take it easy for the rest of today and you should NOT DRIVE or use heavy machinery until tomorrow (because of the sedation medicines used during the test).    FOLLOW UP: Our staff will call the number listed on your records the next business day following your procedure to check on you and address any questions or concerns that you may have regarding the information given to you following your procedure. If we do not reach you, we will leave a message.  However, if you are feeling well and you are not experiencing any problems, there is no need to return our call.  We will assume that you have returned to your regular daily activities without incident.  If any biopsies were taken you will be contacted by phone or by letter within the next 1-3 weeks.  Please call us at [phone] if you have not heard about the biopsies in 3 weeks.    SIGNATURES/CONFIDENTIALITY: You and/or your care partner have signed paperwork which will be entered into your electronic medical record.  These signatures attest to the fact that that the information above on your After Visit Summary has been reviewed and is understood.  Full responsibility of the confidentiality of this discharge information lies with you and/or your care-partner.   Hold Aspirin ans all NSAIDS for 2 weeks,but resume remainder of medications. Information given on polyps,hemorrhoids and high fiber diet.

## 2015-02-09 NOTE — Op Note (Signed)
[facility]  [address] [place], [address]   COLONOSCOPY PROCEDURE REPORT  PATIENT: Matthew Noble, Matthew Noble  MR#: [id number] BIRTHDATE: [birth year] , 59  yrs. old GENDER: male ENDOSCOPIST: Ladene Artist, MD, [facility] REFERRED PJ:[id number] Coletta Memos, M.D. PROCEDURE DATE:  02/09/2015 PROCEDURE:   Colonoscopy, diagnostic and Colonoscopy with snare polypectomy First Screening Colonoscopy - Avg.  risk and is 50 yrs.  old or older - No.  Prior Negative Screening - Now for repeat screening. N/A  History of Adenoma - Now for follow-up colonoscopy & has been > or = to 3 yrs.  Yes hx of adenoma.  Has been 3 or more years since last colonoscopy.  Polyps removed today? Yes ASA CLASS:   Class II INDICATIONS:Evaluation of unexplained GI bleeding, Surveillance due to prior colonic neoplasia, PH Colon Adenoma, heme-positive stool, and hematochezia. MEDICATIONS: Monitored anesthesia care and Propofol 200 mg IV DESCRIPTION OF PROCEDURE:   After the risks benefits and alternatives of the procedure were thoroughly explained, informed consent was obtained.  The digital rectal exam revealed external hemorrhoids.   The LB [id number]  endoscope was introduced through the anus and advanced to the cecum, which was identified by both the appendix and ileocecal valve. No adverse events experienced.   The quality of the prep was good.  (Suprep was used) The instrument was then slowly withdrawn as the colon was fully examined. Estimated blood loss is zero unless otherwise noted in this procedure report.  COLON FINDINGS: Three sessile polyps measuring 5 mm in size were found in the rectum, descending colon, and transverse colon. Polypectomies were performed with a cold snare.  The resection was complete, the polyp tissue was completely retrieved and sent to histology.   A sessile polyp measuring 10 mm in size was found in the sigmoid colon.  A polypectomy was performed using snare cautery.  The  resection was complete, the polyp tissue was completely retrieved and sent to histology.   The examination was otherwise normal.  Retroflexed views revealed internal Grade I hemorrhoids. The time to cecum = 1.5 Withdrawal time = 12.9   The scope was withdrawn and the procedure completed. COMPLICATIONS: There were no immediate complications.  ENDOSCOPIC IMPRESSION: 1.   Three sessile polyps in the rectum, descending, and transverse colon; polypectomies performed with a cold snare 2.   Sessile polyp in the sigmoid colon; polypectomy performed using snare cautery 3.   lnternal and external hemorrhoids  RECOMMENDATIONS: 1.  Hold Aspirin and all other NSAIDS for 2 weeks. 2.  Await pathology results 3.  Repeat colonoscopy in 3 years if larger polyp adenomatous; 5 years if smaller polyps adenomatous; otherwise 10 years  eSigned:  Ladene Artist, MD, [facility] 02/09/2015 8:35 AM

## 2015-02-09 NOTE — Progress Notes (Signed)
Called to room to assist during endoscopic procedure.  Patient ID and intended procedure confirmed with present staff. Received instructions for my participation in the procedure from the performing physician.  

## 2015-02-09 NOTE — Progress Notes (Signed)
To recovery, report to Brown, RN, VSS. 

## 2015-02-10 ENCOUNTER — Telehealth: Payer: Self-pay

## 2015-02-10 NOTE — Telephone Encounter (Signed)
Left message on answering machine. 

## 2015-02-15 ENCOUNTER — Encounter: Payer: Self-pay | Admitting: Gastroenterology

## 2017-10-10 ENCOUNTER — Encounter: Payer: Self-pay | Admitting: Internal Medicine

## 2017-11-17 ENCOUNTER — Other Ambulatory Visit: Payer: Self-pay | Admitting: Urology

## 2017-11-17 DIAGNOSIS — C61 Malignant neoplasm of prostate: Secondary | ICD-10-CM

## 2017-11-17 DIAGNOSIS — R972 Elevated prostate specific antigen [PSA]: Secondary | ICD-10-CM

## 2017-11-26 ENCOUNTER — Ambulatory Visit
Admission: RE | Admit: 2017-11-26 | Discharge: 2017-11-26 | Disposition: A | Discharge status: Home / Self Care | Payer: BC Managed Care – PPO | Source: Ambulatory Visit | Attending: Urology | Admitting: Urology

## 2017-11-26 DIAGNOSIS — R972 Elevated prostate specific antigen [PSA]: Secondary | ICD-10-CM

## 2017-11-26 DIAGNOSIS — C61 Malignant neoplasm of prostate: Secondary | ICD-10-CM

## 2017-11-26 MED ORDER — GADOBENATE DIMEGLUMINE 529 MG/ML IV SOLN
15.0000 mL | Freq: Once | INTRAVENOUS | Status: AC | PRN
  Administered 2017-11-26: 15 mL via INTRAVENOUS

## 2018-06-18 DIAGNOSIS — I10 Essential (primary) hypertension: Secondary | ICD-10-CM | POA: Diagnosis not present

## 2018-06-18 DIAGNOSIS — E119 Type 2 diabetes mellitus without complications: Secondary | ICD-10-CM | POA: Diagnosis not present

## 2018-06-18 DIAGNOSIS — Z0000 Encounter for general adult medical examination without abnormal findings: Secondary | ICD-10-CM | POA: Diagnosis not present

## 2018-06-25 DIAGNOSIS — M7989 Other specified soft tissue disorders: Secondary | ICD-10-CM | POA: Diagnosis not present

## 2018-06-25 DIAGNOSIS — I10 Essential (primary) hypertension: Secondary | ICD-10-CM | POA: Diagnosis not present

## 2018-06-25 DIAGNOSIS — E119 Type 2 diabetes mellitus without complications: Secondary | ICD-10-CM | POA: Diagnosis not present

## 2018-12-22 DIAGNOSIS — C61 Malignant neoplasm of prostate: Secondary | ICD-10-CM | POA: Diagnosis not present

## 2018-12-22 DIAGNOSIS — Z0000 Encounter for general adult medical examination without abnormal findings: Secondary | ICD-10-CM | POA: Diagnosis not present

## 2018-12-22 DIAGNOSIS — I10 Essential (primary) hypertension: Secondary | ICD-10-CM | POA: Diagnosis not present

## 2018-12-22 DIAGNOSIS — E119 Type 2 diabetes mellitus without complications: Secondary | ICD-10-CM | POA: Diagnosis not present

## 2018-12-24 DIAGNOSIS — Z0000 Encounter for general adult medical examination without abnormal findings: Secondary | ICD-10-CM | POA: Diagnosis not present

## 2018-12-24 DIAGNOSIS — Z23 Encounter for immunization: Secondary | ICD-10-CM | POA: Diagnosis not present

## 2018-12-24 DIAGNOSIS — R972 Elevated prostate specific antigen [PSA]: Secondary | ICD-10-CM | POA: Diagnosis not present

## 2018-12-24 DIAGNOSIS — E119 Type 2 diabetes mellitus without complications: Secondary | ICD-10-CM | POA: Diagnosis not present

## 2018-12-24 DIAGNOSIS — C61 Malignant neoplasm of prostate: Secondary | ICD-10-CM | POA: Diagnosis not present

## 2018-12-24 DIAGNOSIS — I10 Essential (primary) hypertension: Secondary | ICD-10-CM | POA: Diagnosis not present

## 2019-02-04 DIAGNOSIS — R350 Frequency of micturition: Secondary | ICD-10-CM | POA: Diagnosis not present

## 2019-06-14 DIAGNOSIS — I10 Essential (primary) hypertension: Secondary | ICD-10-CM | POA: Diagnosis not present

## 2019-06-14 DIAGNOSIS — E119 Type 2 diabetes mellitus without complications: Secondary | ICD-10-CM | POA: Diagnosis not present

## 2019-06-14 DIAGNOSIS — R972 Elevated prostate specific antigen [PSA]: Secondary | ICD-10-CM | POA: Diagnosis not present

## 2019-06-18 ENCOUNTER — Ambulatory Visit: Payer: Self-pay | Attending: Internal Medicine

## 2019-06-18 DIAGNOSIS — Z23 Encounter for immunization: Secondary | ICD-10-CM | POA: Insufficient documentation

## 2019-06-18 NOTE — Progress Notes (Signed)
   Covid-19 Vaccination Clinic  Name:  Matthew Noble    MRN: [id number] DOB: [birth year]  06/18/2019  [name] was observed post Covid-19 immunization for 15 minutes without incidence. He was provided with Vaccine Information Sheet and instruction to access the V-Safe system.   [name] was instructed to call 911 with any severe reactions post vaccine: [name] Kitchen Difficulty breathing  . Swelling of your face and throat  . A fast heartbeat  . A bad rash all over your body  . Dizziness and weakness    Immunizations Administered    Name Date Dose VIS Date Route   Pfizer COVID-19 Vaccine 06/18/2019 12:06 PM 0.3 mL 04/09/2019 Intramuscular   Manufacturer: [place]   Lot: [id number]   [place]: [id number]

## 2019-06-21 DIAGNOSIS — R972 Elevated prostate specific antigen [PSA]: Secondary | ICD-10-CM | POA: Diagnosis not present

## 2019-06-21 DIAGNOSIS — I10 Essential (primary) hypertension: Secondary | ICD-10-CM | POA: Diagnosis not present

## 2019-06-21 DIAGNOSIS — R350 Frequency of micturition: Secondary | ICD-10-CM | POA: Diagnosis not present

## 2019-06-21 DIAGNOSIS — C61 Malignant neoplasm of prostate: Secondary | ICD-10-CM | POA: Diagnosis not present

## 2019-06-21 DIAGNOSIS — K635 Polyp of colon: Secondary | ICD-10-CM | POA: Diagnosis not present

## 2019-06-21 DIAGNOSIS — E119 Type 2 diabetes mellitus without complications: Secondary | ICD-10-CM | POA: Diagnosis not present

## 2019-07-13 ENCOUNTER — Ambulatory Visit: Payer: Medicare Other | Attending: Internal Medicine

## 2019-07-13 DIAGNOSIS — Z23 Encounter for immunization: Secondary | ICD-10-CM

## 2019-07-13 NOTE — Progress Notes (Signed)
   Covid-19 Vaccination Clinic  Name:  AHMIER CERRITOS    MRN: [id number] DOB: Apr 15, 1956  07/13/2019  Mr. Duch was observed post Covid-19 immunization for 15 minutes without incident. He was provided with Vaccine Information Sheet and instruction to access the V-Safe system.   Mr. Chalifoux was instructed to call 911 with any severe reactions post vaccine: [name] Kitchen Difficulty breathing  . Swelling of face and throat  . A fast heartbeat  . A bad rash all over body  . Dizziness and weakness   Immunizations Administered    Name Date Dose VIS Date Route   Pfizer COVID-19 Vaccine 07/13/2019  8:35 AM 0.3 mL 04/09/2019 Intramuscular   Manufacturer: [place]   Lot: [id number]   [place]: [id number]

## 2019-07-19 ENCOUNTER — Encounter (HOSPITAL_COMMUNITY): Payer: Self-pay | Admitting: Family Medicine

## 2019-07-19 ENCOUNTER — Ambulatory Visit (HOSPITAL_COMMUNITY)
Admission: EM | Admit: 2019-07-19 | Discharge: 2019-07-19 | Disposition: A | Discharge status: Home / Self Care | Payer: Medicare Other | Attending: Family Medicine | Admitting: Family Medicine

## 2019-07-19 ENCOUNTER — Other Ambulatory Visit: Payer: Self-pay

## 2019-07-19 DIAGNOSIS — M7751 Other enthesopathy of right foot: Secondary | ICD-10-CM

## 2019-07-19 MED ORDER — PREDNISONE 50 MG PO TABS: ORAL_TABLET | ORAL | 0 refills | Status: DC

## 2019-07-19 NOTE — ED Provider Notes (Signed)
[facility]    CSN: [id number] Arrival date & time: 07/19/19  1223      History   Chief Complaint Chief Complaint  Patient presents with  . Ankle Pain    HPI Matthew Noble is a 64 y.o. male.   This is the initial [facility] visit for this 64 year old man who complains of left ankle pain and swelling.  Onset 2 days ago, no known injury.  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  Pain is worse with weight bearing or movement and the location is behind the medial malleolus.  PMHx is significant for HTN and type 2 diabetes.       Past Medical History:  Diagnosis Date  . Allergy   . Arthritis    NECK  . Colon polyps   . Elevated PSA   . Hypertension   . Nocturia   . Nodular prostate   . Prostate cancer ([place])   . Type 2 diabetes mellitus (H[facility])     There are no problems to display for this patient.   Past Surgical History:  Procedure Laterality Date  . COLONOSCOPY    . [place]  . PROSTATE BIOPSY N/A 04/01/2013   Procedure: PROSTATE BIOPSY AND ULTRASOUND;  Surgeon: Irine Seal, MD;  Location: [facility];  Service: Urology;  Laterality: N/A;  . TONSILLECTOMY  AGE 81       Home Medications    Prior to Admission medications   Medication Sig Start Date End Date Taking? Authorizing Provider  aspirin EC 81 MG tablet Take 81 mg by mouth daily.    [provider]  atenolol-chlorthalidone (TENORETIC) 50-25 MG per tablet Take 1 tablet by mouth every morning.    [provider]  Cholecalciferol (VITAMIN D3) 1000 UNITS CAPS Take 1 capsule by mouth daily.    [provider]  metFORMIN (GLUCOPHAGE) 500 MG tablet Take 1 tablet by mouth daily. 09/18/14   [provider]  predniSONE (DELTASONE) 50 MG tablet One daily with food 07/19/19   Robyn Haber, MD  vitamin B-12 (CYANOCOBALAMIN) 1000 MCG tablet Take 1,000 mcg by mouth daily.    [provider]    Family History Family History    Problem Relation Age of Onset  . Heart disease Father   . Hyperlipidemia Father   . Hypertension Father   . Prostate cancer Brother   . Cancer Mother        type unknown  . Colon cancer Neg Hx   . Esophageal cancer Neg Hx   . Stomach cancer Neg Hx   . Rectal cancer Neg Hx     Social History Social History   Tobacco Use  . Smoking status: Former Smoker    Packs/day: 1.00    Years: 16.00    Pack years: 16.00    Types: Cigarettes    Quit date: 04/29/2012    Years since quitting: 7.2  . Smokeless tobacco: Never Used  Substance Use Topics  . Alcohol use: Yes    Alcohol/week: 4.0 standard drinks    Types: 4 Standard drinks or equivalent per week    Comment: 1/5 of alcohol per week  . Drug use: No     Allergies   Patient has no known allergies.   Review of Systems Review of Systems  Musculoskeletal: Positive for gait problem.  All other systems reviewed and are negative.    Physical Exam Triage Vital Signs ED Triage Vitals  Enc Vitals Group  BP      Pulse      Resp      Temp      Temp src      SpO2      Weight      Height      Head Circumference      Peak Flow      Pain Score      Pain Loc      Pain Edu?      Excl. in [place]?    No data found.  Updated Vital Signs BP (!) 161/82 (BP Location: Right Arm)   Pulse 69   Temp 98.1 F (36.7 C) (Oral)   Resp 16   SpO2 98%    Physical Exam Vitals and nursing note reviewed.  Constitutional:      Appearance: Normal appearance. He is normal weight.     Comments: Athletic appearing  Eyes:     Conjunctiva/sclera: Conjunctivae normal.  Cardiovascular:     Rate and Rhythm: Normal rate.  Pulmonary:     Effort: Pulmonary effort is normal.  Musculoskeletal:        General: Swelling and tenderness present. No deformity or signs of injury. Normal range of motion.     Cervical back: Normal range of motion.  Skin:    General: Skin is warm and dry.     Capillary Refill: Capillary refill takes less than 2  seconds.     Findings: No bruising, erythema or rash.  Neurological:     General: No focal deficit present.     Mental Status: He is alert and oriented to person, place, and time.      UC Treatments / Results  Labs (all labs ordered are listed, but only abnormal results are displayed) Labs Reviewed - No data to display  EKG   Radiology No results found.  Procedures Procedures (including critical care time)  Medications Ordered in UC Medications - No data to display  Initial Impression / Assessment and Plan / UC Course  I have reviewed the triage vital signs and the nursing notes.  Pertinent labs & imaging results that were available during my care of the patient were reviewed by me and considered in my medical decision making (see chart for details).    Final Clinical Impressions(s) / UC Diagnoses   Final diagnoses:  Tendonitis of ankle, right     Discharge Instructions     You should be fine in just 3 days.    ED Prescriptions    Medication Sig Dispense Auth. Provider   predniSONE (DELTASONE) 50 MG tablet One daily with food 5 tablet Robyn Haber, MD     I have reviewed the PDMP during this encounter.   Robyn Haber, MD 07/19/19 [phone]

## 2019-07-19 NOTE — Discharge Instructions (Addendum)
You should be fine in just 3 days.

## 2019-07-19 NOTE — ED Triage Notes (Signed)
Ft ankle pain since Saturday.  No known injury.  Left pedal pulse 2 +

## 2019-10-22 DIAGNOSIS — L089 Local infection of the skin and subcutaneous tissue, unspecified: Secondary | ICD-10-CM | POA: Diagnosis not present

## 2019-12-28 DIAGNOSIS — Z13220 Encounter for screening for lipoid disorders: Secondary | ICD-10-CM | POA: Diagnosis not present

## 2019-12-28 DIAGNOSIS — I10 Essential (primary) hypertension: Secondary | ICD-10-CM | POA: Diagnosis not present

## 2019-12-28 DIAGNOSIS — Z1329 Encounter for screening for other suspected endocrine disorder: Secondary | ICD-10-CM | POA: Diagnosis not present

## 2019-12-28 DIAGNOSIS — E119 Type 2 diabetes mellitus without complications: Secondary | ICD-10-CM | POA: Diagnosis not present

## 2020-01-04 DIAGNOSIS — I10 Essential (primary) hypertension: Secondary | ICD-10-CM | POA: Diagnosis not present

## 2020-01-04 DIAGNOSIS — E119 Type 2 diabetes mellitus without complications: Secondary | ICD-10-CM | POA: Diagnosis not present

## 2020-01-04 DIAGNOSIS — Z0000 Encounter for general adult medical examination without abnormal findings: Secondary | ICD-10-CM | POA: Diagnosis not present

## 2020-05-13 DIAGNOSIS — B349 Viral infection, unspecified: Secondary | ICD-10-CM | POA: Diagnosis not present

## 2020-05-13 DIAGNOSIS — R059 Cough, unspecified: Secondary | ICD-10-CM | POA: Diagnosis not present

## 2020-05-13 DIAGNOSIS — R0981 Nasal congestion: Secondary | ICD-10-CM | POA: Diagnosis not present

## 2020-05-13 DIAGNOSIS — Z7189 Other specified counseling: Secondary | ICD-10-CM | POA: Diagnosis not present

## 2020-06-30 DIAGNOSIS — E1169 Type 2 diabetes mellitus with other specified complication: Secondary | ICD-10-CM | POA: Diagnosis not present

## 2020-06-30 DIAGNOSIS — Z13220 Encounter for screening for lipoid disorders: Secondary | ICD-10-CM | POA: Diagnosis not present

## 2020-06-30 DIAGNOSIS — I10 Essential (primary) hypertension: Secondary | ICD-10-CM | POA: Diagnosis not present

## 2020-06-30 DIAGNOSIS — E119 Type 2 diabetes mellitus without complications: Secondary | ICD-10-CM | POA: Diagnosis not present

## 2020-06-30 DIAGNOSIS — Z1329 Encounter for screening for other suspected endocrine disorder: Secondary | ICD-10-CM | POA: Diagnosis not present

## 2020-07-04 DIAGNOSIS — I10 Essential (primary) hypertension: Secondary | ICD-10-CM | POA: Diagnosis not present

## 2020-07-04 DIAGNOSIS — E119 Type 2 diabetes mellitus without complications: Secondary | ICD-10-CM | POA: Diagnosis not present

## 2020-11-27 DIAGNOSIS — E119 Type 2 diabetes mellitus without complications: Secondary | ICD-10-CM | POA: Diagnosis not present

## 2020-11-27 DIAGNOSIS — Z008 Encounter for other general examination: Secondary | ICD-10-CM | POA: Diagnosis not present

## 2020-11-27 DIAGNOSIS — Z7289 Other problems related to lifestyle: Secondary | ICD-10-CM | POA: Diagnosis not present

## 2020-11-27 DIAGNOSIS — I10 Essential (primary) hypertension: Secondary | ICD-10-CM | POA: Diagnosis not present

## 2020-11-27 DIAGNOSIS — F17211 Nicotine dependence, cigarettes, in remission: Secondary | ICD-10-CM | POA: Diagnosis not present

## 2021-01-11 DIAGNOSIS — Z13220 Encounter for screening for lipoid disorders: Secondary | ICD-10-CM | POA: Diagnosis not present

## 2021-01-11 DIAGNOSIS — Z1329 Encounter for screening for other suspected endocrine disorder: Secondary | ICD-10-CM | POA: Diagnosis not present

## 2021-01-11 DIAGNOSIS — R7309 Other abnormal glucose: Secondary | ICD-10-CM | POA: Diagnosis not present

## 2021-01-11 DIAGNOSIS — E119 Type 2 diabetes mellitus without complications: Secondary | ICD-10-CM | POA: Diagnosis not present

## 2021-01-11 DIAGNOSIS — Z0000 Encounter for general adult medical examination without abnormal findings: Secondary | ICD-10-CM | POA: Diagnosis not present

## 2021-01-11 DIAGNOSIS — I10 Essential (primary) hypertension: Secondary | ICD-10-CM | POA: Diagnosis not present

## 2021-01-19 DIAGNOSIS — E1122 Type 2 diabetes mellitus with diabetic chronic kidney disease: Secondary | ICD-10-CM | POA: Diagnosis not present

## 2021-01-19 DIAGNOSIS — N182 Chronic kidney disease, stage 2 (mild): Secondary | ICD-10-CM | POA: Diagnosis not present

## 2021-01-19 DIAGNOSIS — Z13220 Encounter for screening for lipoid disorders: Secondary | ICD-10-CM | POA: Diagnosis not present

## 2021-01-19 DIAGNOSIS — Z0000 Encounter for general adult medical examination without abnormal findings: Secondary | ICD-10-CM | POA: Diagnosis not present

## 2021-01-19 DIAGNOSIS — I10 Essential (primary) hypertension: Secondary | ICD-10-CM | POA: Diagnosis not present

## 2021-02-05 DIAGNOSIS — R350 Frequency of micturition: Secondary | ICD-10-CM | POA: Diagnosis not present

## 2021-02-15 ENCOUNTER — Other Ambulatory Visit: Payer: Self-pay | Admitting: Urology

## 2021-02-15 DIAGNOSIS — N402 Nodular prostate without lower urinary tract symptoms: Secondary | ICD-10-CM

## 2021-02-15 DIAGNOSIS — C61 Malignant neoplasm of prostate: Secondary | ICD-10-CM

## 2021-02-15 DIAGNOSIS — R972 Elevated prostate specific antigen [PSA]: Secondary | ICD-10-CM

## 2021-03-04 ENCOUNTER — Ambulatory Visit
Admission: RE | Admit: 2021-03-04 | Discharge: 2021-03-04 | Disposition: A | Discharge status: Home / Self Care | Payer: Medicare Other | Source: Ambulatory Visit | Attending: Urology | Admitting: Urology

## 2021-03-04 ENCOUNTER — Other Ambulatory Visit: Payer: Self-pay

## 2021-03-04 DIAGNOSIS — R972 Elevated prostate specific antigen [PSA]: Secondary | ICD-10-CM

## 2021-03-04 DIAGNOSIS — C61 Malignant neoplasm of prostate: Secondary | ICD-10-CM

## 2021-03-04 DIAGNOSIS — N402 Nodular prostate without lower urinary tract symptoms: Secondary | ICD-10-CM

## 2021-03-04 MED ORDER — GADOBENATE DIMEGLUMINE 529 MG/ML IV SOLN
15.0000 mL | Freq: Once | INTRAVENOUS | Status: AC | PRN
  Administered 2021-03-04: 15 mL via INTRAVENOUS

## 2021-04-16 DIAGNOSIS — H9313 Tinnitus, bilateral: Secondary | ICD-10-CM | POA: Diagnosis not present

## 2021-04-16 DIAGNOSIS — I10 Essential (primary) hypertension: Secondary | ICD-10-CM | POA: Diagnosis not present

## 2021-04-16 DIAGNOSIS — H9193 Unspecified hearing loss, bilateral: Secondary | ICD-10-CM | POA: Diagnosis not present

## 2021-04-16 DIAGNOSIS — Z23 Encounter for immunization: Secondary | ICD-10-CM | POA: Diagnosis not present

## 2021-07-10 DIAGNOSIS — H903 Sensorineural hearing loss, bilateral: Secondary | ICD-10-CM | POA: Diagnosis not present

## 2021-07-10 DIAGNOSIS — H9313 Tinnitus, bilateral: Secondary | ICD-10-CM | POA: Diagnosis not present

## 2021-07-16 DIAGNOSIS — Z13220 Encounter for screening for lipoid disorders: Secondary | ICD-10-CM | POA: Diagnosis not present

## 2021-07-16 DIAGNOSIS — Z0000 Encounter for general adult medical examination without abnormal findings: Secondary | ICD-10-CM | POA: Diagnosis not present

## 2021-07-16 DIAGNOSIS — I10 Essential (primary) hypertension: Secondary | ICD-10-CM | POA: Diagnosis not present

## 2021-07-16 DIAGNOSIS — N182 Chronic kidney disease, stage 2 (mild): Secondary | ICD-10-CM | POA: Diagnosis not present

## 2021-07-16 DIAGNOSIS — E1122 Type 2 diabetes mellitus with diabetic chronic kidney disease: Secondary | ICD-10-CM | POA: Diagnosis not present

## 2021-07-19 DIAGNOSIS — H9193 Unspecified hearing loss, bilateral: Secondary | ICD-10-CM | POA: Diagnosis not present

## 2021-07-19 DIAGNOSIS — N182 Chronic kidney disease, stage 2 (mild): Secondary | ICD-10-CM | POA: Diagnosis not present

## 2021-07-19 DIAGNOSIS — E1122 Type 2 diabetes mellitus with diabetic chronic kidney disease: Secondary | ICD-10-CM | POA: Diagnosis not present

## 2021-07-19 DIAGNOSIS — I10 Essential (primary) hypertension: Secondary | ICD-10-CM | POA: Diagnosis not present

## 2021-08-02 DIAGNOSIS — R7989 Other specified abnormal findings of blood chemistry: Secondary | ICD-10-CM | POA: Diagnosis not present

## 2021-08-02 DIAGNOSIS — E1122 Type 2 diabetes mellitus with diabetic chronic kidney disease: Secondary | ICD-10-CM | POA: Diagnosis not present

## 2021-08-02 DIAGNOSIS — I10 Essential (primary) hypertension: Secondary | ICD-10-CM | POA: Diagnosis not present

## 2021-08-02 DIAGNOSIS — N182 Chronic kidney disease, stage 2 (mild): Secondary | ICD-10-CM | POA: Diagnosis not present

## 2021-09-19 DIAGNOSIS — E119 Type 2 diabetes mellitus without complications: Secondary | ICD-10-CM | POA: Diagnosis not present

## 2021-09-19 DIAGNOSIS — H25013 Cortical age-related cataract, bilateral: Secondary | ICD-10-CM | POA: Diagnosis not present

## 2021-09-19 DIAGNOSIS — H52203 Unspecified astigmatism, bilateral: Secondary | ICD-10-CM | POA: Diagnosis not present

## 2021-11-27 DIAGNOSIS — M25532 Pain in left wrist: Secondary | ICD-10-CM | POA: Diagnosis not present

## 2022-02-12 DIAGNOSIS — N182 Chronic kidney disease, stage 2 (mild): Secondary | ICD-10-CM | POA: Diagnosis not present

## 2022-02-12 DIAGNOSIS — R7989 Other specified abnormal findings of blood chemistry: Secondary | ICD-10-CM | POA: Diagnosis not present

## 2022-02-12 DIAGNOSIS — E1122 Type 2 diabetes mellitus with diabetic chronic kidney disease: Secondary | ICD-10-CM | POA: Diagnosis not present

## 2022-02-19 DIAGNOSIS — I10 Essential (primary) hypertension: Secondary | ICD-10-CM | POA: Diagnosis not present

## 2022-02-19 DIAGNOSIS — Z0000 Encounter for general adult medical examination without abnormal findings: Secondary | ICD-10-CM | POA: Diagnosis not present

## 2022-02-19 DIAGNOSIS — Z1212 Encounter for screening for malignant neoplasm of rectum: Secondary | ICD-10-CM | POA: Diagnosis not present

## 2022-02-19 DIAGNOSIS — N182 Chronic kidney disease, stage 2 (mild): Secondary | ICD-10-CM | POA: Diagnosis not present

## 2022-02-19 DIAGNOSIS — K648 Other hemorrhoids: Secondary | ICD-10-CM | POA: Diagnosis not present

## 2022-02-19 DIAGNOSIS — Z23 Encounter for immunization: Secondary | ICD-10-CM | POA: Diagnosis not present

## 2022-02-19 DIAGNOSIS — Z13220 Encounter for screening for lipoid disorders: Secondary | ICD-10-CM | POA: Diagnosis not present

## 2022-02-19 DIAGNOSIS — E1122 Type 2 diabetes mellitus with diabetic chronic kidney disease: Secondary | ICD-10-CM | POA: Diagnosis not present

## 2022-04-02 ENCOUNTER — Other Ambulatory Visit: Payer: Self-pay | Admitting: Urology

## 2022-04-02 DIAGNOSIS — R972 Elevated prostate specific antigen [PSA]: Secondary | ICD-10-CM

## 2022-04-02 DIAGNOSIS — N403 Nodular prostate with lower urinary tract symptoms: Secondary | ICD-10-CM

## 2022-04-02 DIAGNOSIS — C61 Malignant neoplasm of prostate: Secondary | ICD-10-CM

## 2022-04-30 DIAGNOSIS — J0101 Acute recurrent maxillary sinusitis: Secondary | ICD-10-CM | POA: Diagnosis not present

## 2022-06-13 ENCOUNTER — Ambulatory Visit
Admission: RE | Admit: 2022-06-13 | Discharge: 2022-06-13 | Disposition: A | Discharge status: Home / Self Care | Payer: Medicare Other | Source: Ambulatory Visit | Attending: Urology | Admitting: Urology

## 2022-06-13 DIAGNOSIS — R972 Elevated prostate specific antigen [PSA]: Secondary | ICD-10-CM

## 2022-06-13 DIAGNOSIS — C61 Malignant neoplasm of prostate: Secondary | ICD-10-CM

## 2022-06-13 DIAGNOSIS — N403 Nodular prostate with lower urinary tract symptoms: Secondary | ICD-10-CM

## 2022-06-13 MED ORDER — GADOPICLENOL 0.5 MMOL/ML IV SOLN
7.5000 mL | Freq: Once | INTRAVENOUS | Status: AC | PRN
  Administered 2022-06-13: 7.5 mL via INTRAVENOUS

## 2022-08-12 DIAGNOSIS — Z13220 Encounter for screening for lipoid disorders: Secondary | ICD-10-CM | POA: Diagnosis not present

## 2022-08-12 DIAGNOSIS — E1122 Type 2 diabetes mellitus with diabetic chronic kidney disease: Secondary | ICD-10-CM | POA: Diagnosis not present

## 2022-08-12 DIAGNOSIS — I10 Essential (primary) hypertension: Secondary | ICD-10-CM | POA: Diagnosis not present

## 2022-08-12 DIAGNOSIS — Z0000 Encounter for general adult medical examination without abnormal findings: Secondary | ICD-10-CM | POA: Diagnosis not present

## 2022-08-12 DIAGNOSIS — N182 Chronic kidney disease, stage 2 (mild): Secondary | ICD-10-CM | POA: Diagnosis not present

## 2022-08-13 ENCOUNTER — Other Ambulatory Visit (HOSPITAL_COMMUNITY): Payer: Self-pay

## 2022-08-19 DIAGNOSIS — E1122 Type 2 diabetes mellitus with diabetic chronic kidney disease: Secondary | ICD-10-CM | POA: Diagnosis not present

## 2022-08-19 DIAGNOSIS — N182 Chronic kidney disease, stage 2 (mild): Secondary | ICD-10-CM | POA: Diagnosis not present

## 2022-08-19 DIAGNOSIS — I10 Essential (primary) hypertension: Secondary | ICD-10-CM | POA: Diagnosis not present

## 2023-03-07 DIAGNOSIS — Z0000 Encounter for general adult medical examination without abnormal findings: Secondary | ICD-10-CM | POA: Diagnosis not present

## 2023-03-07 DIAGNOSIS — E1122 Type 2 diabetes mellitus with diabetic chronic kidney disease: Secondary | ICD-10-CM | POA: Diagnosis not present

## 2023-03-07 DIAGNOSIS — I10 Essential (primary) hypertension: Secondary | ICD-10-CM | POA: Diagnosis not present

## 2023-03-10 IMAGING — MR MR PROSTATE WO/W CM
12 series · 48 of 48 positions shown · IV contrast (15 ml multihance)
Comparison: None.

CLINICAL DATA: Elevated PSA, 65-year-old male

EXAM:
MR PROSTATE WITHOUT AND WITH CONTRAST
TECHNIQUE: Multiplanar multisequence MRI images were obtained of the pelvis
centered about the prostate. Pre and post contrast images were
obtained.
CONTRAST:  15mL MULTIHANCE GADOBENATE DIMEGLUMINE 529 MG/ML IV SOLN

[Series 3: T2 · coronal · 3.0mm · 0.56mm/px · 1 of 30 slices shown (1 of 3)]
[im 1/30]
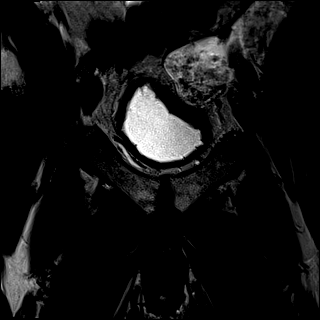

[Series 4: T1 · axial · 5.0mm · 1.25mm/px · 1 of 80 slices shown]
[im 1/80]
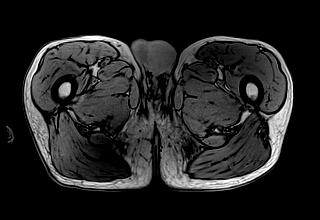

[Series 5: DWI · axial · 3.0mm · 1.75mm/px · 1 of 60 slices shown (1 of 3)]
[im 1/60]
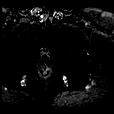

[Series 6: DWI · axial · 3.0mm · 1.75mm/px · 1 of 20 slices shown (2 of 3)]
[im 1/20]
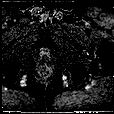

[Series 7: DWI · axial · 3.0mm · 1.75mm/px · 1 of 20 slices shown (3 of 3)]
[im 1/20]
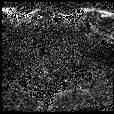

[Series 8: T2 · axial · 3.0mm · 0.56mm/px · 1 of 33 slices shown (2 of 3)]
[im 1/33]
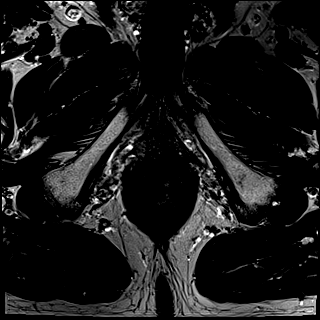

[Series 9: T2 · axial · 1.0mm · 1.04mm/px · 1 of 88 slices shown (3 of 3)]
[im 1/88]
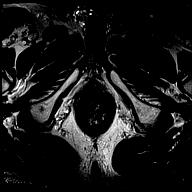

[Series 10: pre t1_twist_tra_dyn · axial · non-contrast · 3.5mm · 0.83mm/px · 1 of 30 slices shown]
[im 1/30]
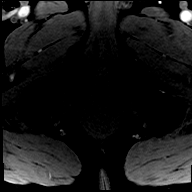

[Series 11: post t1_twist_tra_dyn-copy center · axial · non-contrast · 3.5mm · 0.83mm/px · z∈[-51,+51]mm · 18 of 876 slices shown]
[im 1/876]
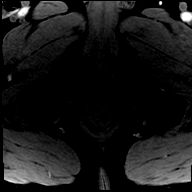
[im 52/876]
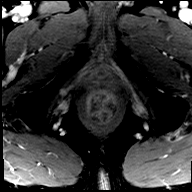
[im 103/876]
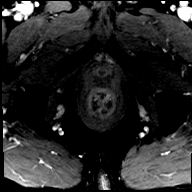
[im 155/876]
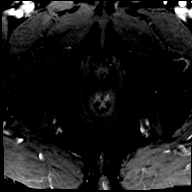
[im 206/876]
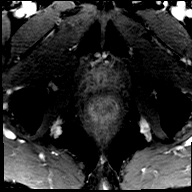
[im 258/876]
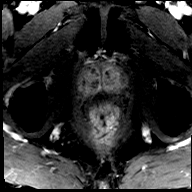
[im 309/876]
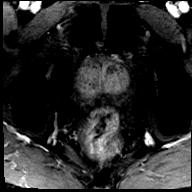
[im 361/876]
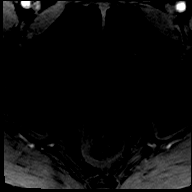
[im 412/876]
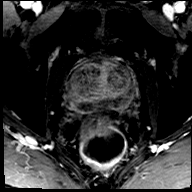
[im 464/876]
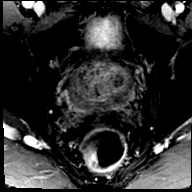
[im 515/876]
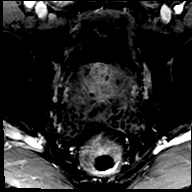
[im 567/876]
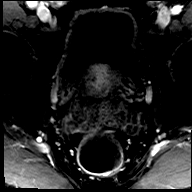
[im 618/876]
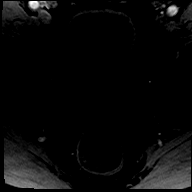
[im 670/876]
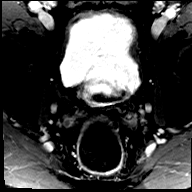
[im 721/876]
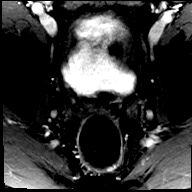
[im 773/876]
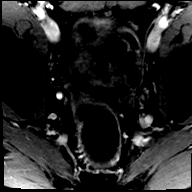
[im 824/876]
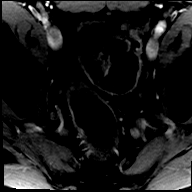
[im 876/876]
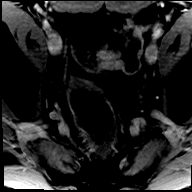

[Series 12: post t1_twist_tra_dyn-copy cent_sub · axial · 3.5mm · 0.83mm/px · z∈[-51,+51]mm · 18 of 865 slices shown]
[im 1/865]
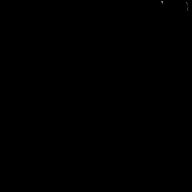
[im 51/865]
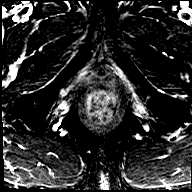
[im 102/865]
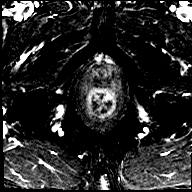
[im 153/865]
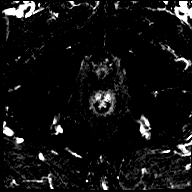
[im 204/865]
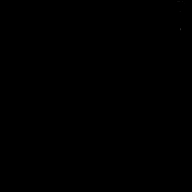
[im 255/865]
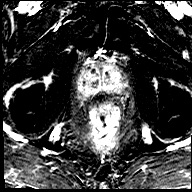
[im 305/865]
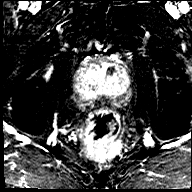
[im 356/865]
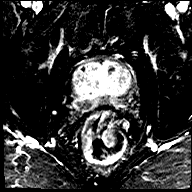
[im 407/865]
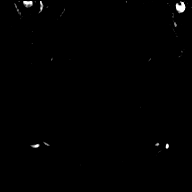
[im 458/865]
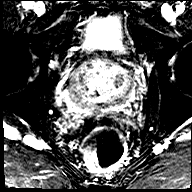
[im 509/865]
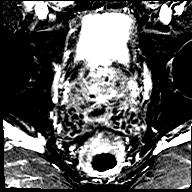
[im 560/865]
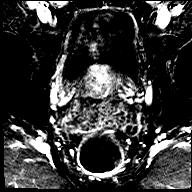
[im 610/865]
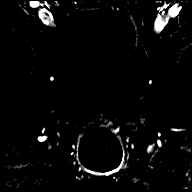
[im 661/865]
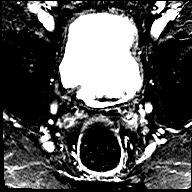
[im 712/865]
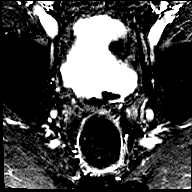
[im 763/865]
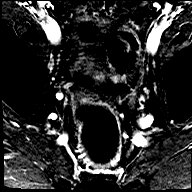
[im 814/865]
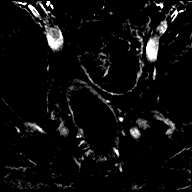
[im 865/865]
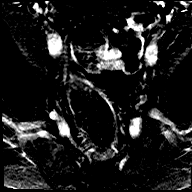

[Series 13: t1_vibe_dixon_tra_f · axial · 2.5mm · 0.91mm/px · z∈[-93,+104]mm · 2 of 74 slices shown]
[im 1/74]
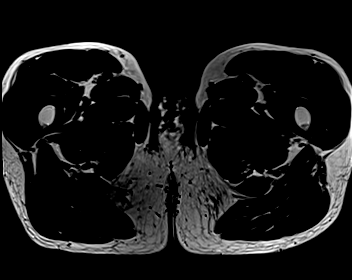
[im 74/74]
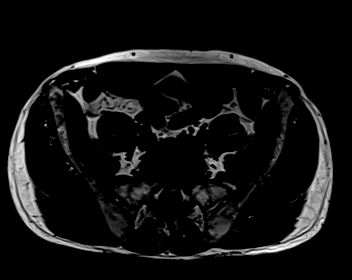

[Series 14: t1_vibe_dixon_tra_w · axial · 2.5mm · 0.91mm/px · z∈[-93,+104]mm · 2 of 80 slices shown]
[im 1/80]
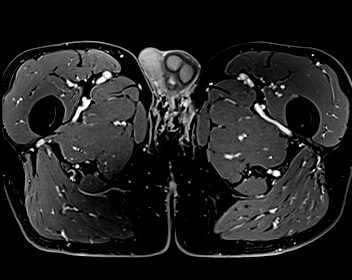
[im 80/80]
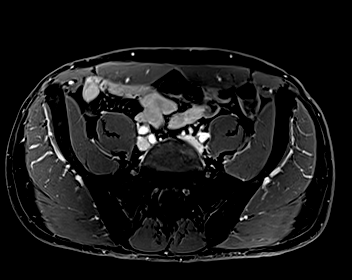

[48 of 48 positions shown; findings below may reference images not displayed]

FINDINGS: Prostate: Single small focus of restricted diffusion within the
midline mid RIGHT gland peripheral zone measuring 6 mm (image
11/series 5). Difficult to finding corresponding lesion on T2
weighted imaging. No additional foci of restricted diffusion within
the peripheral zone.

The transitional zone is enlarged by well capsulated nodules without
suspicious imaging characteristics on T2 weighted imaging.

No abnormal postcontrast enhancement identified.

Volume: 5.0 x 5.8 x 3.4 cm (volume = 52 cm^3)

Transcapsular spread:  Absent

Seminal vesicle involvement: Absent

Neurovascular bundle involvement: Absent

Pelvic adenopathy: Absent

Bone metastasis: Absent

Other findings: None
IMPRESSION: 1. Focus restricted diffusion in the posterior RIGHT mid gland
peripheral zone just off midline. Lesion is small and difficult to
localize on the T2 weighted imaging. Findings indeterminate for
high-grade prostate carcinoma. PI-RADS: 3. (Dynacad 3D post
processing performed). DADO #1.
2. Nodular transitional zone most consistent with benign prostate
hypertrophy. PI-RADS: 2

## 2023-03-11 DIAGNOSIS — I10 Essential (primary) hypertension: Secondary | ICD-10-CM | POA: Diagnosis not present

## 2023-03-11 DIAGNOSIS — S39012A Strain of muscle, fascia and tendon of lower back, initial encounter: Secondary | ICD-10-CM | POA: Diagnosis not present

## 2023-03-11 DIAGNOSIS — Z0000 Encounter for general adult medical examination without abnormal findings: Secondary | ICD-10-CM | POA: Diagnosis not present

## 2023-03-11 DIAGNOSIS — R7303 Prediabetes: Secondary | ICD-10-CM | POA: Diagnosis not present

## 2023-04-08 ENCOUNTER — Ambulatory Visit (HOSPITAL_COMMUNITY)
Admission: EM | Admit: 2023-04-08 | Discharge: 2023-04-08 | Disposition: A | Discharge status: Home / Self Care | Payer: Medicare Other

## 2023-04-08 ENCOUNTER — Encounter (HOSPITAL_COMMUNITY): Payer: Self-pay

## 2023-04-08 DIAGNOSIS — M25562 Pain in left knee: Secondary | ICD-10-CM

## 2023-04-08 DIAGNOSIS — M7052 Other bursitis of knee, left knee: Secondary | ICD-10-CM | POA: Diagnosis not present

## 2023-04-08 MED ORDER — PREDNISONE 20 MG PO TABS
40.0000 mg | ORAL_TABLET | Freq: Every day | ORAL | 0 refills | Status: AC
Start: 2023-04-08 — End: 2023-04-13

## 2023-04-08 NOTE — ED Triage Notes (Signed)
Pt presents with knee pain x 5 days. Denies any recent trauma or falls to note.

## 2023-04-08 NOTE — ED Provider Notes (Signed)
[facility]    CSN: [id number] Arrival date & time: 04/08/23  4098      History   Chief Complaint Chief Complaint  Patient presents with   Knee Pain    HPI Matthew Noble is a 67 y.o. male.  Left knee pain and swelling for 5 days Hurts to bend the knee No injury, trauma, or falls. Although does play pickleball Has not had fever or chills  Denies prior knee injury  Hx DM; A1c a month ago was 5.9  Past Medical History:  Diagnosis Date   Allergy    Arthritis    NECK   Colon polyps    Elevated PSA    Hypertension    Nocturia    Nodular prostate    Prostate cancer (HCC)    Type 2 diabetes mellitus (HCC)     There are no problems to display for this patient.   Past Surgical History:  Procedure Laterality Date   COLONOSCOPY     HEMORRHOID SURGERY  1975   PROSTATE BIOPSY N/A 04/01/2013   Procedure: PROSTATE BIOPSY AND ULTRASOUND;  Surgeon: Bjorn Pippin, MD;  Location: [facility];  Service: Urology;  Laterality: N/A;   TONSILLECTOMY  AGE 43       Home Medications    Prior to Admission medications   Medication Sig Start Date End Date Taking? Authorizing Provider  aspirin EC 81 MG tablet Take 81 mg by mouth daily.   Yes [provider]  chlorthalidone (HYGROTON) 25 MG tablet SMARTSIG:1.0 Tablet(s) By Mouth Daily   Yes [provider]  Cholecalciferol (VITAMIN D3) 1000 UNITS CAPS Take 1 capsule by mouth daily.   Yes [provider]  lisinopril (ZESTRIL) 10 MG tablet SMARTSIG:1.0 Tablet(s) By Mouth Daily   Yes [provider]  metFORMIN (GLUCOPHAGE) 500 MG tablet Take 1 tablet by mouth daily. 09/18/14  Yes [provider]  predniSONE (DELTASONE) 20 MG tablet Take 2 tablets (40 mg total) by mouth daily with breakfast for 5 days. 04/08/23 04/13/23 Yes Saidee Geremia, PA-C  vitamin B-12 (CYANOCOBALAMIN) 1000 MCG tablet Take 1,000 mcg by mouth daily.    [provider]    Family  History Family History  Problem Relation Age of Onset   Heart disease Father    Hyperlipidemia Father    Hypertension Father    Prostate cancer Brother    Cancer Mother        type unknown   Colon cancer Neg Hx    Esophageal cancer Neg Hx    Stomach cancer Neg Hx    Rectal cancer Neg Hx     Social History Social History   Tobacco Use   Smoking status: Former    Current packs/day: 0.00    Average packs/day: 1 pack/day for 16.0 years (16.0 ttl pk-yrs)    Types: Cigarettes    Start date: 04/29/1996    Quit date: 04/29/2012    Years since quitting: 10.9   Smokeless tobacco: Never  Substance Use Topics   Alcohol use: Yes    Alcohol/week: 4.0 standard drinks of alcohol    Types: 4 Standard drinks or equivalent per week    Comment: 1/5 of alcohol per week   Drug use: No     Allergies   Patient has no known allergies.   Review of Systems Review of Systems Per HPI  Physical Exam Triage Vital Signs ED Triage Vitals [04/08/23 0822]  Encounter Vitals Group     BP 126/71  Systolic BP Percentile      Diastolic BP Percentile      Pulse Rate 85     Resp 16     Temp 98.8 F (37.1 C)     Temp Source Oral     SpO2 98 %     Weight      Height      Head Circumference      Peak Flow      Pain Score      Pain Loc      Pain Education      Exclude from Growth Chart    No data found.  Updated Vital Signs BP 126/71 (BP Location: Left Arm)   Pulse 85   Temp 98.8 F (37.1 C) (Oral)   Resp 16   SpO2 98%   Physical Exam Vitals and nursing note reviewed.  Constitutional:      General: He is not in acute distress. Cardiovascular:     Rate and Rhythm: Normal rate and regular rhythm.     Pulses: Normal pulses.     Heart sounds: Normal heart sounds.  Pulmonary:     Effort: Pulmonary effort is normal.     Breath sounds: Normal breath sounds.  Musculoskeletal:     Right knee: Normal.     Left knee: Swelling present. Decreased range of motion. Tenderness present.  Normal pulse.     Comments: Left knee swelling and tenderness. Can bend to about 80 degrees before pain begins. Distal sensation intact. Strong PT pulse.  Skin:    General: Skin is warm and dry.     Capillary Refill: Capillary refill takes less than 2 seconds.  Neurological:     Mental Status: He is alert and oriented to person, place, and time.     Gait: Gait abnormal (antalgic).      UC Treatments / Results  Labs (all labs ordered are listed, but only abnormal results are displayed) Labs Reviewed - No data to display  EKG  Radiology No results found.  Procedures Procedures (including critical care time)  Medications Ordered in UC Medications - No data to display  Initial Impression / Assessment and Plan / UC Course  I have reviewed the triage vital signs and the nursing notes.  Pertinent labs & imaging results that were available during my care of the patient were reviewed by me and considered in my medical decision making (see chart for details).  Likely bursitis Low concern for septic joint at this time; patient is afebrile, well appearing, and has fairly good ROM despite pain and swelling. Discussed strict ED precautions for any acute change in symptoms. At this time we will defer xray; likely soft tissue etiology. Knee brace provided for support. Advised ice and elevate. Prednisone 40 mg daily x 5 Follow up with orthopedics Work note provided. Patient agrees to plan  Final Clinical Impressions(s) / UC Diagnoses   Final diagnoses:  Bursitis of left knee, unspecified bursa  Acute pain of left knee     Discharge Instructions      Rest - try to avoid high impact activity Ice - apply for 15-20 minutes a few times daily Compression - use knee brace for support Elevation - prop up on a pillow  Prednisone - 40 mg (2 tablets) daily for 5 days in a row  If at any point symptoms worsen or you cannot bend the knee at all, go directly to the emergency department   Otherwise please follow up with orthopedics  ED Prescriptions     Medication Sig Dispense Auth. Provider   predniSONE (DELTASONE) 20 MG tablet Take 2 tablets (40 mg total) by mouth daily with breakfast for 5 days. 10 tablet Eustacio Ellen, Lurena Joiner, PA-C      PDMP not reviewed this encounter.   Marlow Baars, [place] 04/08/23 [phone]

## 2023-04-08 NOTE — Discharge Instructions (Addendum)
Rest - try to avoid high impact activity Ice - apply for 15-20 minutes a few times daily Compression - use knee brace for support Elevation - prop up on a pillow  Prednisone - 40 mg (2 tablets) daily for 5 days in a row  If at any point symptoms worsen or you cannot bend the knee at all, go directly to the emergency department  Otherwise please follow up with orthopedics

## 2023-05-22 ENCOUNTER — Other Ambulatory Visit: Payer: Self-pay | Admitting: Urology

## 2023-05-22 DIAGNOSIS — C61 Malignant neoplasm of prostate: Secondary | ICD-10-CM

## 2023-05-22 DIAGNOSIS — N403 Nodular prostate with lower urinary tract symptoms: Secondary | ICD-10-CM

## 2023-05-28 DIAGNOSIS — J3489 Other specified disorders of nose and nasal sinuses: Secondary | ICD-10-CM | POA: Diagnosis not present

## 2023-05-28 DIAGNOSIS — R051 Acute cough: Secondary | ICD-10-CM | POA: Diagnosis not present

## 2023-05-28 DIAGNOSIS — R0981 Nasal congestion: Secondary | ICD-10-CM | POA: Diagnosis not present

## 2023-07-03 ENCOUNTER — Ambulatory Visit
Admission: RE | Admit: 2023-07-03 | Discharge: 2023-07-03 | Disposition: A | Discharge status: Home / Self Care | Payer: Medicare Other | Source: Ambulatory Visit | Attending: Urology | Admitting: Urology

## 2023-07-03 DIAGNOSIS — C61 Malignant neoplasm of prostate: Secondary | ICD-10-CM

## 2023-07-03 DIAGNOSIS — N403 Nodular prostate with lower urinary tract symptoms: Secondary | ICD-10-CM

## 2023-07-03 MED ORDER — GADOPICLENOL 0.5 MMOL/ML IV SOLN
7.5000 mL | Freq: Once | INTRAVENOUS | Status: AC | PRN
  Administered 2023-07-03: 7.5 mL via INTRAVENOUS

## 2023-07-12 ENCOUNTER — Other Ambulatory Visit: Payer: Self-pay | Admitting: Urology
